# Patient Record
Sex: Male | Born: 2000 | Race: Black or African American | Hispanic: No | Marital: Single | Smoking: Never smoker
Health system: Southern US, Community
[De-identification: ages and names within clinical notes are randomized; demographics above are authoritative.]

## PROBLEM LIST (undated history)

## (undated) DIAGNOSIS — J45909 Unspecified asthma, uncomplicated: Secondary | ICD-10-CM

---
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
## 2022-01-16 ENCOUNTER — Ambulatory Visit (INDEPENDENT_AMBULATORY_CARE_PROVIDER_SITE_OTHER): Payer: BC Managed Care – PPO | Admitting: Family
